# Patient Record
Sex: Male | Born: 1985 | Race: White | Hispanic: No | Marital: Single | Smoking: Never smoker
Health system: Southern US, Community
[De-identification: ages and names within clinical notes are randomized; demographics above are authoritative.]

---

## 2019-09-30 ENCOUNTER — Other Ambulatory Visit: Payer: Self-pay

## 2019-09-30 ENCOUNTER — Emergency Department

## 2019-09-30 ENCOUNTER — Emergency Department
Admission: EM | Admit: 2019-09-30 | Discharge: 2019-09-30 | Disposition: A | Discharge status: Home / Self Care | Attending: Emergency Medicine | Admitting: Emergency Medicine

## 2019-09-30 ENCOUNTER — Encounter: Payer: Self-pay | Admitting: Emergency Medicine

## 2019-09-30 DIAGNOSIS — Y92838 Other recreation area as the place of occurrence of the external cause: Secondary | ICD-10-CM | POA: Diagnosis not present

## 2019-09-30 DIAGNOSIS — S62109A Fracture of unspecified carpal bone, unspecified wrist, initial encounter for closed fracture: Secondary | ICD-10-CM

## 2019-09-30 DIAGNOSIS — S62112A Displaced fracture of triquetrum [cuneiform] bone, left wrist, initial encounter for closed fracture: Secondary | ICD-10-CM | POA: Diagnosis not present

## 2019-09-30 DIAGNOSIS — S0081XA Abrasion of other part of head, initial encounter: Secondary | ICD-10-CM | POA: Insufficient documentation

## 2019-09-30 DIAGNOSIS — S76302A Unspecified injury of muscle, fascia and tendon of the posterior muscle group at thigh level, left thigh, initial encounter: Secondary | ICD-10-CM | POA: Insufficient documentation

## 2019-09-30 DIAGNOSIS — Y9302 Activity, running: Secondary | ICD-10-CM | POA: Insufficient documentation

## 2019-09-30 DIAGNOSIS — S6992XA Unspecified injury of left wrist, hand and finger(s), initial encounter: Secondary | ICD-10-CM | POA: Diagnosis present

## 2019-09-30 DIAGNOSIS — W010XXA Fall on same level from slipping, tripping and stumbling without subsequent striking against object, initial encounter: Secondary | ICD-10-CM | POA: Diagnosis not present

## 2019-09-30 DIAGNOSIS — Y998 Other external cause status: Secondary | ICD-10-CM | POA: Insufficient documentation

## 2019-09-30 DIAGNOSIS — S62102A Fracture of unspecified carpal bone, left wrist, initial encounter for closed fracture: Secondary | ICD-10-CM | POA: Insufficient documentation

## 2019-09-30 NOTE — Discharge Instructions (Addendum)
I suspect you have torn your hamstring.  Please limit activity to left leg.  You have a fracture in your left wrist.  Please wear wrist splint.  Ice and elevate splint wrist is much as possible.  Also ice your hamstring.  Please call Dr. Joice Lofts or a follow-up appointment this week.

## 2019-09-30 NOTE — ED Triage Notes (Signed)
Pt here for left hamstring pain. Was sprinting and felt something pop. Severe pain when trying to straighten leg. Ambulatory.  Pain in left wrist from catching himself as he fell from pop in leg. Also has abrasion to nose. No LOC

## 2019-09-30 NOTE — ED Provider Notes (Signed)
[facility] Emergency Department Provider Note  ____________________________________________  Time seen: Approximately 7:35 AM  I have reviewed the triage vital signs and the nursing notes.   HISTORY  Chief Complaint Leg Injury    HPI Terry Jones is a 34 y.o. male that presents to the emergency department for evaluation of left hamstring pain after injury today.  Patient was doing sprints for exercise and when he was running, he felt a pop to his hamstring.  While he was still trying to move forward, his left leg then "would not go" and he fell forward and landed on his left wrist and nose.  He he has been able to walk on the leg since but the back of his hamstring is still semipainful.  Tetanus shot is up-to-date.  No loss of consciousness.  History reviewed. No pertinent past medical history.  There are no problems to display for this patient.   History reviewed. No pertinent surgical history.  Prior to Admission medications   Not on File    Allergies Patient has no known allergies.  History reviewed. No pertinent family history.  Social History Social History   Tobacco Use  . Smoking status: Never Smoker  . Smokeless tobacco: Never Used  Substance Use Topics  . Alcohol use: Yes  . Drug use: Never     Review of Systems  Cardiovascular: No chest pain. Respiratory: No SOB. Gastrointestinal: No abdominal pain.  No nausea, no vomiting.  Musculoskeletal: Positive for wrist and leg pain. Skin: Negative for rash, lacerations, ecchymosis. Positive for abrasion. Neurological: Negative for headaches, numbness or tingling   ____________________________________________   PHYSICAL EXAM:  VITAL SIGNS: ED Triage Vitals  Enc Vitals Group     BP --      Pulse --      Resp --      Temp --      Temp src --      SpO2 --      Weight 09/30/19 0730 280 lb (127 kg)     Height 09/30/19 0730 6\' 2"  (1.88 m)     Head Circumference --      Peak Flow  --      Pain Score 09/30/19 0728 8     Pain Loc --      Pain Edu? --      Excl. in GC? --      Constitutional: Alert and oriented. Well appearing and in no acute distress. Eyes: Conjunctivae are normal. PERRL. EOMI. Head: Atraumatic. ENT:      Ears:      Nose: No congestion/rhinnorhea.      Mouth/Throat: Mucous membranes are moist.  Neck: No stridor. No cervical spine tenderness to palpation.  Symmetric radial pulses bilaterally.   Cardiovascular: Normal rate, regular rhythm.  Good peripheral circulation. Respiratory: Normal respiratory effort without tachypnea or retractions. Lungs CTAB. Good air entry to the bases with no decreased or absent breath sounds. Musculoskeletal: Full range of motion to all extremities. No gross deformities appreciated.  Full range of motion of left wrist but with pain with supination.  Tenderness to palpation to ulnar wrist.  No swelling or ecchymosis.  Tenderness to palpation to left mid hamstring.  Full range of motion of left hip.  Able to flex and extend left hip and left knee.  Antalgic gait. Neurologic:  Normal speech and language. No gross focal neurologic deficits are appreciated.  Skin:  Skin is warm, dry and intact. No rash noted. Psychiatric: Mood and  affect are normal. Speech and behavior are normal. Patient exhibits appropriate insight and judgement.   ____________________________________________   LABS (all labs ordered are listed, but only abnormal results are displayed)  Labs Reviewed - No data to display ____________________________________________  EKG   ____________________________________________  RADIOLOGY Robinette Haines, personally viewed and evaluated these images (plain radiographs) as part of my medical decision making, as well as reviewing the written report by the radiologist.  DG Wrist Complete Left  Result Date: 09/30/2019 CLINICAL DATA:  [place], left wrist pain EXAM: LEFT WRIST - COMPLETE 3+ VIEW COMPARISON:  None.  FINDINGS: Frontal, oblique, lateral, and ulnar deviated views of the left wrist are obtained. On the lateral view, there are 2 displaced fracture fragments dorsal to the proximal carpal row, likely representing triquetral fractures. There is anatomic alignment of the left wrist. Joint spaces are well preserved. Dorsal soft tissue swelling is noted. IMPRESSION: 1. Mildly comminuted avulsion fractures off the dorsal margin of the triquetrum, only seen on lateral view. The Electronically Signed   By: Randa Ngo M.D.   On: 09/30/2019 08:15    ____________________________________________    PROCEDURES  Procedure(s) performed:    Procedures    Medications - No data to display   ____________________________________________   INITIAL IMPRESSION / ASSESSMENT AND PLAN / ED COURSE  Pertinent labs & imaging results that were available during my care of the patient were reviewed by me and considered in my medical decision making (see chart for details).  Review of the [place] CSRS was performed in accordance of the [place] prior to dispensing any controlled drugs.   Patient's diagnosis is consistent with hamstring injury and traquetrum avulsion fracture.  Vital signs and exam are reassuring.  Wrist x-ray shows likely avulsion fractures of traquetrum.  Volar wrist splint was placed.  Sling was given.  I suspect that patient has a tear to his left hamstring.  He will use crutches and call orthopedics for a follow-up appointment for further evaluation.  Patient declines crutches.  Patient is to follow up with orthopedics as directed. Referral was given to Dr. Roland Rack. Patient is given ED precautions to return to the ED for any worsening or new symptoms.   Terry Jones was evaluated in Emergency Department on 09/30/2019 for the symptoms described in the history of present illness. He was evaluated in the context of the global COVID-19 pandemic, which necessitated consideration that the patient might be at  risk for infection with the SARS-CoV-2 virus that causes COVID-19. Institutional protocols and algorithms that pertain to the evaluation of patients at risk for COVID-19 are in a state of rapid change based on information released by regulatory bodies including the CDC and federal and state organizations. These policies and algorithms were followed during the patient's care in the ED.  ____________________________________________  FINAL CLINICAL IMPRESSION(S) / ED DIAGNOSES  Final diagnoses:  Avulsion fracture of wrist  Displaced fracture of triquetrum (cuneiform) bone, left wrist, initial encounter for closed fracture  Left hamstring injury, initial encounter  Abrasion of face, initial encounter      NEW MEDICATIONS STARTED DURING THIS VISIT:  ED Discharge Orders    None          This chart was dictated using voice recognition software/Dragon. Despite best efforts to proofread, errors can occur which can change the meaning. Any change was purely unintentional.    Laban Emperor, PA-C 09/30/19 1616    Vanessa Dover, MD 10/01/19 [phone]

## 2019-09-30 NOTE — ED Notes (Signed)
See triage note  Presents with pain to posterior left upper leg  States he felt a pop to area while sprinting   States he kept on sprinting and fell forward  Having some pain to left wrist also   No deformity noted   Good pulses

## 2021-01-27 IMAGING — DX DG WRIST COMPLETE 3+V*L*
4 series · 4 of 4 positions shown · non-contrast
Comparison: None.

CLINICAL DATA: Fell, left wrist pain

EXAM:
LEFT WRIST - COMPLETE 3+ VIEW

[wrist ap (1 of 2)]
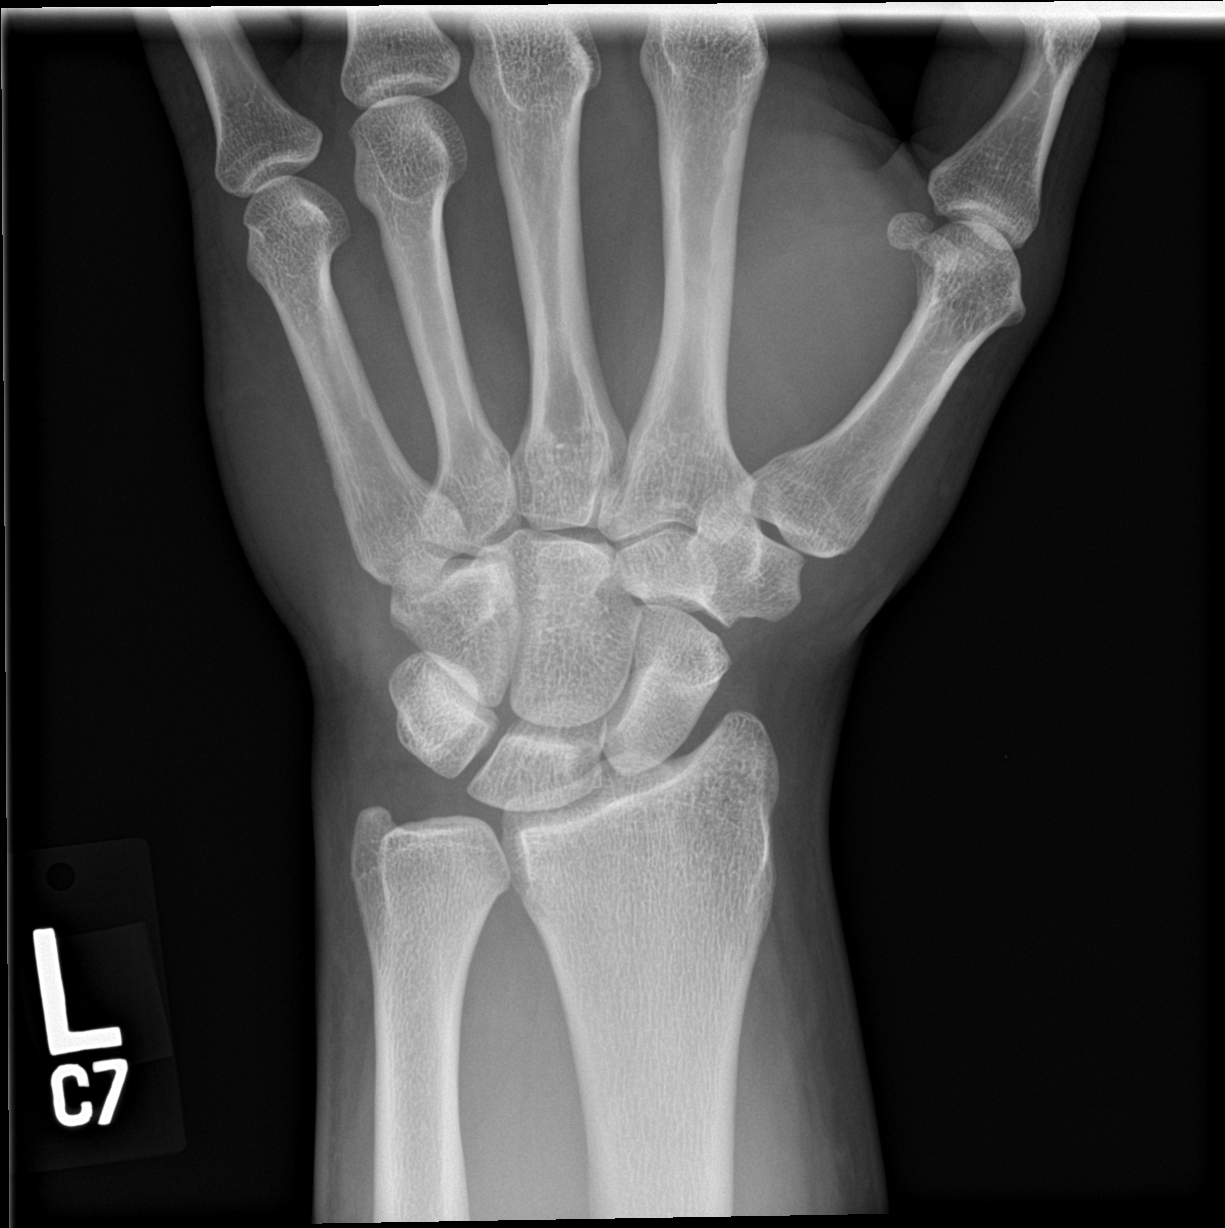

[wrist obl]
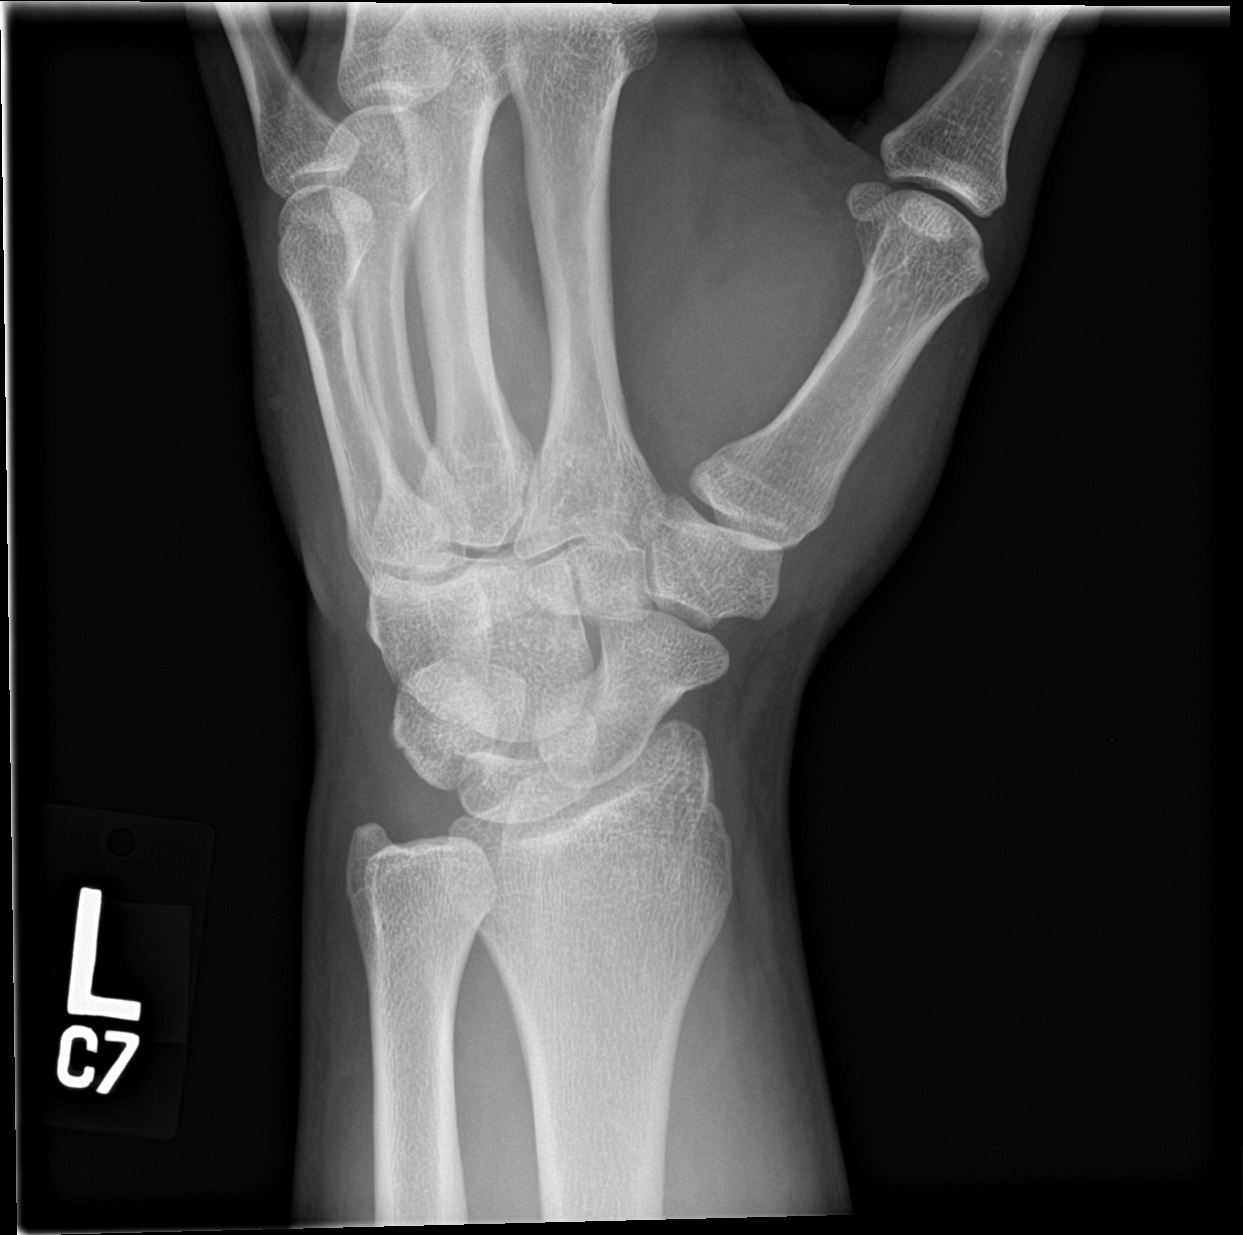

[wrist lat]
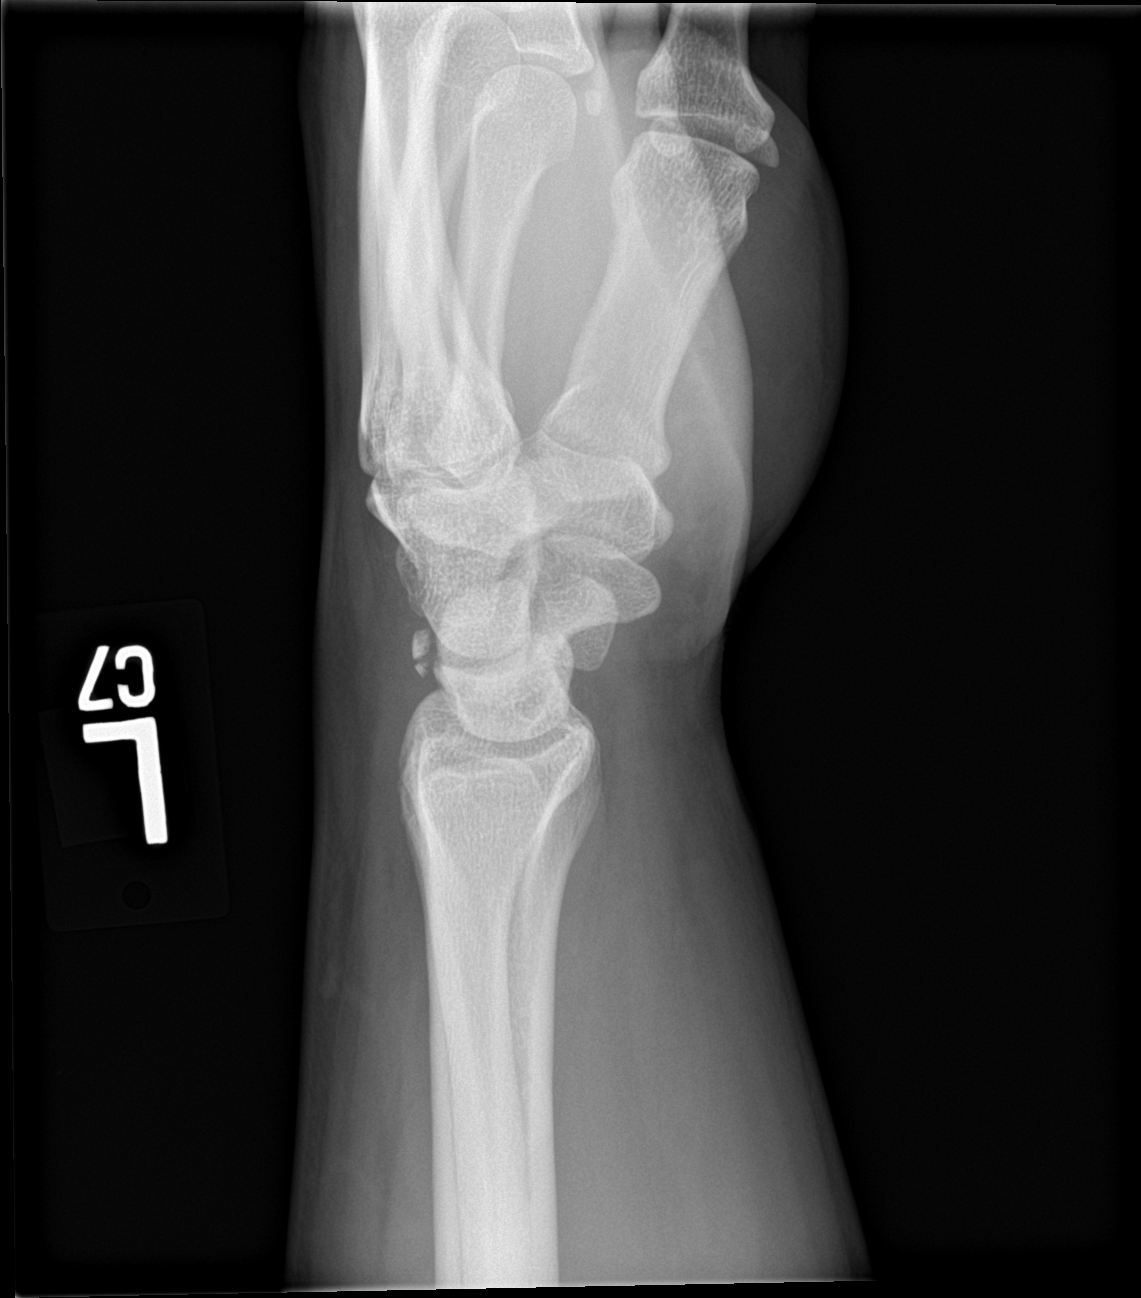

[wrist ap (2 of 2)]
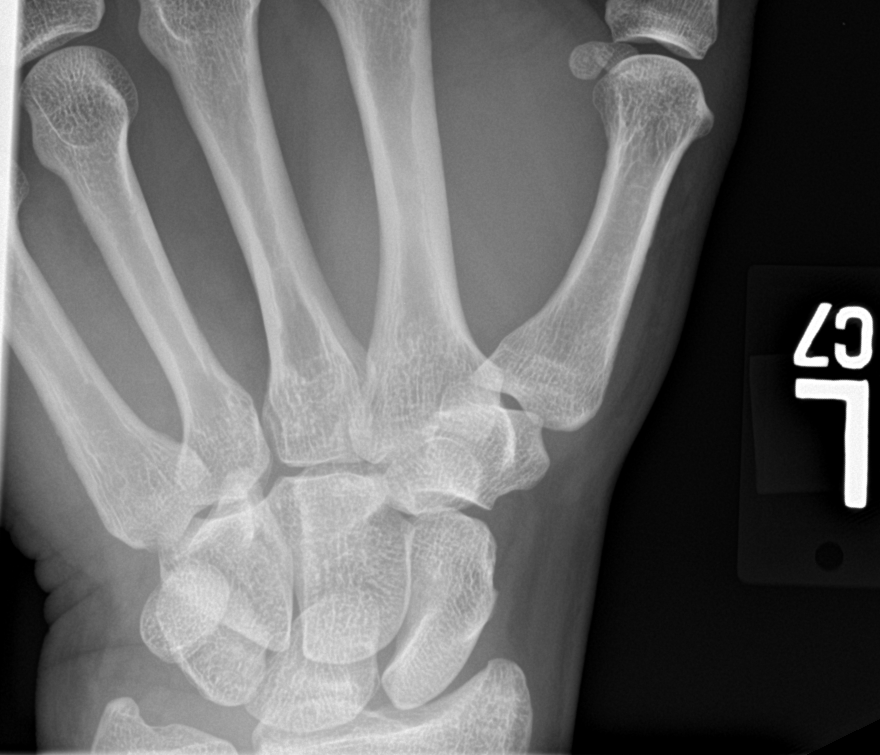

[4 of 4 positions shown; findings below may reference images not displayed]

FINDINGS: Frontal, oblique, lateral, and ulnar deviated views of the left
wrist are obtained. On the lateral view, there are 2 displaced
fracture fragments dorsal to the proximal carpal row, likely
representing triquetral fractures. There is anatomic alignment of
the left wrist. Joint spaces are well preserved. Dorsal soft tissue
swelling is noted.
IMPRESSION: 1. Mildly comminuted avulsion fractures off the dorsal margin of the
triquetrum, only seen on lateral view. The
